# Patient Record
Sex: Female | Born: 1995 | Race: Black or African American | Hispanic: No | Marital: Single | Smoking: Never smoker
Health system: Southern US, Community
[De-identification: ages and names within clinical notes are randomized; demographics above are authoritative.]

## PROBLEM LIST (undated history)

## (undated) DIAGNOSIS — N946 Dysmenorrhea, unspecified: Secondary | ICD-10-CM

## (undated) HISTORY — DX: Dysmenorrhea, unspecified: N94.6

---

## 2014-11-15 ENCOUNTER — Encounter: Payer: Self-pay | Admitting: Certified Nurse Midwife

## 2014-11-15 ENCOUNTER — Ambulatory Visit (INDEPENDENT_AMBULATORY_CARE_PROVIDER_SITE_OTHER): Payer: BLUE CROSS/BLUE SHIELD | Admitting: Certified Nurse Midwife

## 2014-11-15 ENCOUNTER — Encounter: Payer: Self-pay | Admitting: Emergency Medicine

## 2014-11-15 VITALS — BP 110/62 | HR 68 | Resp 16 | Ht 64.25 in | Wt 226.0 lb

## 2014-11-15 DIAGNOSIS — N6320 Unspecified lump in the left breast, unspecified quadrant: Secondary | ICD-10-CM

## 2014-11-15 DIAGNOSIS — N63 Unspecified lump in breast: Secondary | ICD-10-CM

## 2014-11-15 NOTE — Progress Notes (Signed)
Patient scheduled for ultrasound of L Breast at [facility] imaging for 11/16/14 at 0800. Patient agreeable to time/date/location.

## 2014-11-15 NOTE — Progress Notes (Signed)
Reviewed personally.  M. Suzanne Skyra Crichlow, MD.  

## 2014-11-15 NOTE — Progress Notes (Signed)
18 y.o. G0P0000 Single  African American Fe here to establish gyn care and for problem with left breast only. Patient noted lump in breast on SBE on 11/10/14, [place] and was told to see gyn for evaluation and needs ? Mammogram and US. Denies skin change, nipple discharge or pain.Marland Kitchen.No family history of breast cancer. 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. Previous use of OCP for contraception until 9/15. Currently uses condoms. [place]  [place] which was normal per patient. No other health issues today.  Patient's last menstrual period was 11/12/2014.          Sexually active: Yes.    The current method of family planning is none.    Exercising: No.  exercise Smoker:  no  Health Maintenance: Pap:  none MMG:  none Colonoscopy:  none BMD:   none TDaP:  2014 Labs: none Self breast exam: done occ   reports that she has never smoked. She does not have any smokeless tobacco history on file. She reports that she does not drink alcohol or use illicit drugs.  Past Medical History  Diagnosis Date  . Dysmenorrhea     History reviewed. No pertinent past surgical history.  No current outpatient prescriptions on file.   No current facility-administered medications for this visit.    History reviewed. No pertinent family history.  ROS:  Pertinent items are noted in HPI.  Otherwise, a comprehensive ROS was negative.  Exam:   BP 110/62 mmHg  Pulse 68  Resp 16  Ht 5' 4.25" (1.632 m)  Wt 226 lb (102.513 kg)  BMI 38.49 kg/m2  LMP 11/12/2014 Height: 5' 4.25" (163.2 cm) Ht Readings from Last 3 Encounters:  11/15/14 5' 4.25" (1.632 m)    General appearance: alert, cooperative and appears stated age Head: Normocephalic, without obvious abnormality, atraumatic Breasts: normal appearance, no masses or tenderness, No nipple retraction or dimpling, No nipple discharge or bleeding, No axillary or supraclavicular adenopathy, on right. Left breast normal appearance, no nipple  retraction or dimpling. No mipple discharge or bleeding, No axillary or supraclavicular adenopathy. 3 cm moveable mass noted at 10 o'clock  5 fb from areola, non tender to touch.  Neurologic: Grossly normal   Chaperone present: NO  A:  Left breast mass  Normal breast exam on right, large pendulous breasts  P:   Reviewed findings with patient and possible etiology of cyst or fibroadenoma. Discussed needs evaluation with US and possible diagnostic mammogram if needed. Patient agreeable to above. Will schedule.

## 2014-11-16 ENCOUNTER — Ambulatory Visit
Admission: RE | Admit: 2014-11-16 | Discharge: 2014-11-16 | Disposition: A | Discharge status: Home / Self Care | Payer: BC Managed Care – PPO | Source: Ambulatory Visit | Attending: Certified Nurse Midwife | Admitting: Certified Nurse Midwife

## 2014-11-16 ENCOUNTER — Other Ambulatory Visit: Payer: Self-pay | Admitting: Certified Nurse Midwife

## 2014-11-16 DIAGNOSIS — N6320 Unspecified lump in the left breast, unspecified quadrant: Secondary | ICD-10-CM

## 2014-11-22 ENCOUNTER — Telehealth: Payer: Self-pay | Admitting: Emergency Medicine

## 2014-11-22 NOTE — Telephone Encounter (Signed)
Agree with recall.  Encounter closed.

## 2014-11-22 NOTE — Telephone Encounter (Signed)
-----   Message from Verner Choleborah S Leonard, CNM sent at 11/17/2014  4:51 PM EST ----- Reviewed left breast us, palpable mass felt to be fat necrosis and needs follow up in 6 weeks Mm hold and recall?

## 2014-11-22 NOTE — Telephone Encounter (Signed)
Spoke with patient.  She has follow up ultrasound scheduled at the breast center for 12/28/14.  Scheduled breast recheck with Verner Choleborah S. Leonard CNM. For 01/04/15.   6 week recall placed.  Will continue to hold.   Routing to Verner Choleborah S. Leonard CNM  And Dr. Hyacinth MeekerMiller.   Okay to close?

## 2014-11-22 NOTE — Telephone Encounter (Signed)
yes

## 2014-12-28 ENCOUNTER — Telehealth: Payer: Self-pay | Admitting: Emergency Medicine

## 2014-12-28 ENCOUNTER — Other Ambulatory Visit: Payer: BC Managed Care – PPO

## 2014-12-28 NOTE — Telephone Encounter (Signed)
Call to patient. She did not keep appointment today for follow up ultrasound and has breast check scheduled. Patient states she has appointment at breast center on 3/6, called and spoke with cherish at breast center and she confirms patient does not have appointment.   Will call patient and discuss, reschedule breast check for after follow up ultrasound.

## 2014-12-29 ENCOUNTER — Encounter (HOSPITAL_COMMUNITY): Payer: Self-pay | Admitting: Emergency Medicine

## 2014-12-29 ENCOUNTER — Other Ambulatory Visit: Payer: Self-pay

## 2014-12-29 ENCOUNTER — Emergency Department (HOSPITAL_COMMUNITY)
Admission: EM | Admit: 2014-12-29 | Discharge: 2014-12-29 | Disposition: A | Discharge status: Home / Self Care | Payer: BC Managed Care – PPO | Attending: Emergency Medicine | Admitting: Emergency Medicine

## 2014-12-29 DIAGNOSIS — Z8742 Personal history of other diseases of the female genital tract: Secondary | ICD-10-CM | POA: Insufficient documentation

## 2014-12-29 DIAGNOSIS — J039 Acute tonsillitis, unspecified: Secondary | ICD-10-CM

## 2014-12-29 DIAGNOSIS — J029 Acute pharyngitis, unspecified: Secondary | ICD-10-CM | POA: Diagnosis present

## 2014-12-29 DIAGNOSIS — K088 Other specified disorders of teeth and supporting structures: Secondary | ICD-10-CM | POA: Insufficient documentation

## 2014-12-29 LAB — RAPID STREP SCREEN (MED CTR MEBANE ONLY): Streptococcus, Group A Screen (Direct): NEGATIVE

## 2014-12-29 MED ORDER — IBUPROFEN 800 MG PO TABS: 800.0000 mg | ORAL_TABLET | Freq: Three times a day (TID) | ORAL | Status: DC

## 2014-12-29 NOTE — Discharge Instructions (Signed)
Take ibuprofen every 8 hours as needed for pain. Use salt water gargles. Rest and stay well hydrated.  Tonsillitis Tonsillitis is an infection of the throat that causes the tonsils to become red, tender, and swollen. Tonsils are collections of lymphoid tissue at the back of the throat. Each tonsil has crevices (crypts). Tonsils help fight nose and throat infections and keep infection from spreading to other parts of the body for the first 18 months of life.  CAUSES Sudden (acute) tonsillitis is usually caused by infection with streptococcal bacteria. Long-lasting (chronic) tonsillitis occurs when the crypts of the tonsils become filled with pieces of food and bacteria, which makes it easy for the tonsils to become repeatedly infected. SYMPTOMS  Symptoms of tonsillitis include:  A sore throat, with possible difficulty swallowing.  White patches on the tonsils.  Fever.  Tiredness.  New episodes of snoring during sleep, when you did not snore before.  Small, foul-smelling, yellowish-white pieces of material (tonsilloliths) that you occasionally cough up or spit out. The tonsilloliths can also cause you to have bad breath. DIAGNOSIS Tonsillitis can be diagnosed through a physical exam. Diagnosis can be confirmed with the results of lab tests, including a throat culture. TREATMENT  The goals of tonsillitis treatment include the reduction of the severity and duration of symptoms and prevention of associated conditions. Symptoms of tonsillitis can be improved with the use of steroids to reduce the swelling. Tonsillitis caused by bacteria can be treated with antibiotic medicines. Usually, treatment with antibiotic medicines is started before the cause of the tonsillitis is known. However, if it is determined that the cause is not bacterial, antibiotic medicines will not treat the tonsillitis. If attacks of tonsillitis are severe and frequent, your health care provider may recommend surgery to remove  the tonsils (tonsillectomy). HOME CARE INSTRUCTIONS   Rest as much as possible and get plenty of sleep.  Drink plenty of fluids. While the throat is very sore, eat soft foods or liquids, such as sherbet, soups, or instant breakfast drinks.  Eat frozen ice pops.  Gargle with a warm or cold liquid to help soothe the throat. Mix 1/4 teaspoon of salt and 1/4 teaspoon of baking soda in 8 oz of water. SEEK MEDICAL CARE IF:   Large, tender lumps develop in your neck.  A rash develops.  A green, yellow-brown, or bloody substance is coughed up.  You are unable to swallow liquids or food for 24 hours.  You notice that only one of the tonsils is swollen. SEEK IMMEDIATE MEDICAL CARE IF:   You develop any new symptoms such as vomiting, severe headache, stiff neck, chest pain, or trouble breathing or swallowing.  You have severe throat pain along with drooling or voice changes.  You have severe pain, unrelieved with recommended medications.  You are unable to fully open the mouth.  You develop redness, swelling, or severe pain anywhere in the neck.  You have a fever. MAKE SURE YOU:   Understand these instructions.  Will watch your condition.  Will get help right away if you are not doing well or get worse. Document Released: 07/29/2005 Document Revised: 03/05/2014 Document Reviewed: 04/07/2013 [facility] Patient Information 2015 [place]. This information is not intended to replace advice given to you by your health care provider. Make sure you discuss any questions you have with your health care provider.  Upper Respiratory Infection, Adult An upper respiratory infection (URI) is also sometimes known as the common cold. The upper respiratory tract includes the  nose, sinuses, throat, trachea, and bronchi. Bronchi are the airways leading to the lungs. Most people improve within 1 week, but symptoms can last up to 2 weeks. A residual cough may last even longer.  CAUSES Many  different viruses can infect the tissues lining the upper respiratory tract. The tissues become irritated and inflamed and often become very moist. Mucus production is also common. A cold is contagious. You can easily spread the virus to others by oral contact. This includes kissing, sharing a glass, coughing, or sneezing. Touching your mouth or nose and then touching a surface, which is then touched by another person, can also spread the virus. SYMPTOMS  Symptoms typically develop 1 to 3 days after you come in contact with a cold virus. Symptoms vary from person to person. They may include:  Runny nose.  Sneezing.  Nasal congestion.  Sinus irritation.  Sore throat.  Loss of voice (laryngitis).  Cough.  Fatigue.  Muscle aches.  Loss of appetite.  Headache.  Low-grade fever. DIAGNOSIS  You might diagnose your own cold based on familiar symptoms, since most people get a cold 2 to 3 times a year. Your caregiver can confirm this based on your exam. Most importantly, your caregiver can check that your symptoms are not due to another disease such as strep throat, sinusitis, pneumonia, asthma, or epiglottitis. Blood tests, throat tests, and X-rays are not necessary to diagnose a common cold, but they may sometimes be helpful in excluding other more serious diseases. Your caregiver will decide if any further tests are required. RISKS AND COMPLICATIONS  You may be at risk for a more severe case of the common cold if you smoke cigarettes, have chronic heart disease (such as heart failure) or lung disease (such as asthma), or if you have a weakened immune system. The very young and very old are also at risk for more serious infections. Bacterial sinusitis, middle ear infections, and bacterial pneumonia can complicate the common cold. The common cold can worsen asthma and chronic obstructive pulmonary disease (COPD). Sometimes, these complications can require emergency medical care and may be  life-threatening. PREVENTION  The best way to protect against getting a cold is to practice good hygiene. Avoid oral or hand contact with people with cold symptoms. Wash your hands often if contact occurs. There is no clear evidence that vitamin C, vitamin E, echinacea, or exercise reduces the chance of developing a cold. However, it is always recommended to get plenty of rest and practice good nutrition. TREATMENT  Treatment is directed at relieving symptoms. There is no cure. Antibiotics are not effective, because the infection is caused by a virus, not by bacteria. Treatment may include:  Increased fluid intake. Sports drinks offer valuable electrolytes, sugars, and fluids.  Breathing heated mist or steam (vaporizer or shower).  Eating chicken soup or other clear broths, and maintaining good nutrition.  Getting plenty of rest.  Using gargles or lozenges for comfort.  Controlling fevers with ibuprofen or acetaminophen as directed by your caregiver.  Increasing usage of your inhaler if you have asthma. Zinc gel and zinc lozenges, taken in the first 24 hours of the common cold, can shorten the duration and lessen the severity of symptoms. Pain medicines may help with fever, muscle aches, and throat pain. A variety of non-prescription medicines are available to treat congestion and runny nose. Your caregiver can make recommendations and may suggest nasal or lung inhalers for other symptoms.  HOME CARE INSTRUCTIONS   Only take over-the-counter  or prescription medicines for pain, discomfort, or fever as directed by your caregiver.  Use a warm mist humidifier or inhale steam from a shower to increase air moisture. This may keep secretions moist and make it easier to breathe.  Drink enough water and fluids to keep your urine clear or pale yellow.  Rest as needed.  Return to work when your temperature has returned to normal or as your caregiver advises. You may need to stay home longer to  avoid infecting others. You can also use a face mask and careful hand washing to prevent spread of the virus. SEEK MEDICAL CARE IF:   After the first few days, you feel you are getting worse rather than better.  You need your caregiver's advice about medicines to control symptoms.  You develop chills, worsening shortness of breath, or brown or red sputum. These may be signs of pneumonia.  You develop yellow or brown nasal discharge or pain in the face, especially when you bend forward. These may be signs of sinusitis.  You develop a fever, swollen neck glands, pain with swallowing, or white areas in the back of your throat. These may be signs of strep throat. SEEK IMMEDIATE MEDICAL CARE IF:   You have a fever.  You develop severe or persistent headache, ear pain, sinus pain, or chest pain.  You develop wheezing, a prolonged cough, cough up blood, or have a change in your usual mucus (if you have chronic lung disease).  You develop sore muscles or a stiff neck. Document Released: 04/14/2001 Document Revised: 01/11/2012 Document Reviewed: 01/24/2014 [facility] Patient Information 2015 [place], [place]. This information is not intended to replace advice given to you by your health care provider. Make sure you discuss any questions you have with your health care provider. Salt Water Gargle This solution will help make your mouth and throat feel better. HOME CARE INSTRUCTIONS   Mix 1 teaspoon of salt in 8 ounces of warm water.  Gargle with this solution as much or often as you need or as directed. Swish and gargle gently if you have any sores or wounds in your mouth.  Do not swallow this mixture. Document Released: 07/23/2004 Document Revised: 01/11/2012 Document Reviewed: 12/14/2008 [facility] Patient Information 2015 [place], [place]. This information is not intended to replace advice given to you by your health care provider. Make sure you discuss any questions you have with your health care  provider.

## 2014-12-29 NOTE — ED Notes (Signed)
Pt. Stated, My tonsils are swollen and I hurt on the rt. Side of my mouth

## 2014-12-29 NOTE — ED Notes (Signed)
Declined W/C at D/C and was escorted to lobby by RN. 

## 2014-12-29 NOTE — ED Provider Notes (Signed)
CSN: [id number]     Arrival date & time 12/29/14  1043 History  This chart was scribed for Celene Skeenobyn Edrian Melucci, PA-C working with Arby BarretteMarcy Pfeiffer, MD by Elveria Risingimelie Horne, ED Scribe. This patient was seen in room TR05C/TR05C and the patient's care was started at 11:07 AM.   Chief Complaint  Patient presents with  . Sore Throat  . Dental Pain   The history is provided by the patient. No language interpreter was used.   HPI Comments: Karina Meadows is a 19 y.o. female who presents to the Emergency Department complaining of sore throat for two days. In additional to her tonsillar pain and swelling, patient reports pain and swelling in her right cheek extending to her gum; patient denies true dental pain. Patient reports treatment with Benadryl, without relief. Patient reports associated cough, subjective fever, and hoarse voice. Patient denies vomiting. Patient denies recent sick contacts nor does she work with children.   Past Medical History  Diagnosis Date  . Dysmenorrhea    History reviewed. No pertinent past surgical history. Family History  Problem Relation Age of Onset  . Cancer Maternal Grandmother   . Diabetes Maternal Grandmother   . Hypertension Maternal Grandmother    History  Substance Use Topics  . Smoking status: Never Smoker   . Smokeless tobacco: Not on file  . Alcohol Use: No   OB History    Gravida Para Term Preterm AB TAB SAB Ectopic Multiple Living   0 0 0 0 0 0 0 0 0 0      Review of Systems  Constitutional: Negative for fever and chills.  HENT: Positive for sore throat and voice change. Negative for dental problem and trouble swallowing.   Respiratory: Positive for cough.   Gastrointestinal: Negative for vomiting.   Allergies  Review of patient's allergies indicates no known allergies.  Home Medications   Prior to Admission medications   Medication Sig Start Date End Date Taking? Authorizing Provider  ibuprofen (ADVIL,MOTRIN) 800 MG tablet Take 1 tablet (800 mg  total) by mouth 3 (three) times daily. 12/29/14   Kathrynn Speedobyn M Lurie Mullane, PA-C   Triage Vitals: BP 113/80 mmHg  Pulse 69  Temp(Src) 98.3 F (36.8 C) (Oral)  Resp 18  SpO2 100%  LMP 12/07/2014 Physical Exam  Constitutional: She is oriented to person, place, and time. She appears well-developed and well-nourished. No distress.  HENT:  Head: Normocephalic and atraumatic.  Tonsils enlarged and inflamed bilateral +2 on R +3 on L without exudate. No tonsillar abscess.  Eyes: Conjunctivae and EOM are normal.  Neck: Normal range of motion. Neck supple.  Cardiovascular: Normal rate, regular rhythm and normal heart sounds.   Pulmonary/Chest: Effort normal and breath sounds normal. No respiratory distress.  Musculoskeletal: Normal range of motion. She exhibits no edema.  Lymphadenopathy:    She has no cervical adenopathy.  Neurological: She is alert and oriented to person, place, and time. No sensory deficit.  Skin: Skin is warm and dry.  Psychiatric: She has a normal mood and affect. Her behavior is normal.  Nursing note and vitals reviewed.   ED Course  Procedures (including critical care time)  COORDINATION OF CARE: 11:10 AM- Discussed treatment plan with patient at bedside and patient agreed to plan.   Labs Review Labs Reviewed  RAPID STREP SCREEN    Imaging Review No results found.   EKG Interpretation None      MDM   Final diagnoses:  Tonsillitis   NAD. AFVSS. Does not meet centor criteria  for strep tx. Rapid strep negative. Discussed symptomatic treatment. Swallows secretions well. Dentition normal. Stable for d/c. Return precautions given. Patient states understanding of treatment care plan and is agreeable.  I personally performed the services described in this documentation, which was scribed in my presence. The recorded information has been reviewed and is accurate.  Kathrynn Speed, PA-C 12/29/14 1143  Arby Barrette, MD 01/02/15 [phone]

## 2014-12-31 LAB — CULTURE, GROUP A STREP

## 2014-12-31 NOTE — Telephone Encounter (Signed)
Call to patient. She cannot keep appointment for 01/07/15 as scheduled. Requested patient call [facility] directly to schedule appointment that will work with her schedule. Advised that appointment on 01/06/15 did not exist because was Saturday date.   Cancelled appointment for tomorrow with Verner Choleborah S. Leonard CNM for Breast Check per patient request as patient will have ultrasound first. Advised patient to call back with ultrasound schedule and will schedule follow up breast check at this time. Patient remains in Mammogram hold.

## 2014-12-31 NOTE — Telephone Encounter (Signed)
Called patient. Scheduled ultrasound for 01/08/15 at 1300 per her request.  Then, office visit for breast check with Verner Choleborah S. Leonard CNM scheduled for same date at 1430.   Patient is agreeable with this plan. Routing to provider for final review. Patient agreeable to disposition. Will close encounter

## 2014-12-31 NOTE — Telephone Encounter (Signed)
Call to patient. Voicemail box not set up yet.   Patient does not have an ultrasound appointment scheduled for 3/6 (saturday) patient was scheduled for 12/28/14 but missed appointment for follow up breast ultrasound. Scheduled ultrasound appointment and will need to notify patient of new appointment and reschedule follow up breast check for after ultrasound.

## 2015-01-01 ENCOUNTER — Ambulatory Visit: Payer: BLUE CROSS/BLUE SHIELD | Admitting: Certified Nurse Midwife

## 2015-01-04 ENCOUNTER — Ambulatory Visit: Payer: BLUE CROSS/BLUE SHIELD | Admitting: Certified Nurse Midwife

## 2015-01-07 ENCOUNTER — Other Ambulatory Visit: Payer: BC Managed Care – PPO

## 2015-01-08 ENCOUNTER — Ambulatory Visit (INDEPENDENT_AMBULATORY_CARE_PROVIDER_SITE_OTHER): Payer: BLUE CROSS/BLUE SHIELD | Admitting: Certified Nurse Midwife

## 2015-01-08 ENCOUNTER — Ambulatory Visit
Admission: RE | Admit: 2015-01-08 | Discharge: 2015-01-08 | Disposition: A | Discharge status: Home / Self Care | Payer: BC Managed Care – PPO | Source: Ambulatory Visit | Attending: Certified Nurse Midwife | Admitting: Certified Nurse Midwife

## 2015-01-08 ENCOUNTER — Encounter: Payer: Self-pay | Admitting: Certified Nurse Midwife

## 2015-01-08 VITALS — BP 104/78 | HR 80 | Ht 64.25 in | Wt 225.8 lb

## 2015-01-08 DIAGNOSIS — Z30011 Encounter for initial prescription of contraceptive pills: Secondary | ICD-10-CM

## 2015-01-08 DIAGNOSIS — N6320 Unspecified lump in the left breast, unspecified quadrant: Secondary | ICD-10-CM

## 2015-01-08 DIAGNOSIS — N63 Unspecified lump in breast: Secondary | ICD-10-CM

## 2015-01-08 MED ORDER — NORETHINDRONE-ETH ESTRADIOL 1-35 MG-MCG PO TABS: 1.0000 | ORAL_TABLET | Freq: Every day | ORAL | Status: DC

## 2015-01-08 NOTE — Patient Instructions (Signed)
Oral Contraception Use Oral contraceptive pills (OCPs) are medicines taken to prevent pregnancy. OCPs work by preventing the ovaries from releasing eggs. The hormones in OCPs also cause the cervical mucus to thicken, preventing the sperm from entering the uterus. The hormones also cause the uterine lining to become thin, not allowing a fertilized egg to attach to the inside of the uterus. OCPs are highly effective when taken exactly as prescribed. However, OCPs do not prevent sexually transmitted diseases (STDs). Safe sex practices, such as using condoms along with an OCP, can help prevent STDs. Before taking OCPs, you may have a physical exam and Pap test. Your health care provider may also order blood tests if necessary. Your health care provider will make sure you are a good candidate for oral contraception. Discuss with your health care provider the possible side effects of the OCP you may be prescribed. When starting an OCP, it can take 2 to 3 months for the body to adjust to the changes in hormone levels in your body.  HOW TO TAKE ORAL CONTRACEPTIVE PILLS Your health care provider may advise you on how to start taking the first cycle of OCPs. Otherwise, you can:   Start on day 1 of your menstrual period. You will not need any backup contraceptive protection with this start time.   Start on the first Sunday after your menstrual period or the day you get your prescription. In these cases, you will need to use backup contraceptive protection for the first week.   Start the pill at any time of your cycle. If you take the pill within 5 days of the start of your period, you are protected against pregnancy right away. In this case, you will not need a backup form of birth control. If you start at any other time of your menstrual cycle, you will need to use another form of birth control for 7 days. If your OCP is the type called a minipill, it will protect you from pregnancy after taking it for 2 days (48  hours). After you have started taking OCPs:   If you forget to take 1 pill, take it as soon as you remember. Take the next pill at the regular time.   If you miss 2 or more pills, call your health care provider because different pills have different instructions for missed doses. Use backup birth control until your next menstrual period starts.   If you use a 28-day pack that contains inactive pills and you miss 1 of the last 7 pills (pills with no hormones), it will not matter. Throw away the rest of the non-hormone pills and start a new pill pack.  No matter which day you start the OCP, you will always start a new pack on that same day of the week. Have an extra pack of OCPs and a backup contraceptive method available in case you miss some pills or lose your OCP pack.  HOME CARE INSTRUCTIONS   Do not smoke.   Always use a condom to protect against STDs. OCPs do not protect against STDs.   Use a calendar to mark your menstrual period days.   Read the information and directions that came with your OCP. Talk to your health care provider if you have questions.  SEEK MEDICAL CARE IF:   You develop nausea and vomiting.   You have abnormal vaginal discharge or bleeding.   You develop a rash.   You miss your menstrual period.   You are losing   your hair.   You need treatment for mood swings or depression.   You get dizzy when taking the OCP.   You develop acne from taking the OCP.   You become pregnant.  SEEK IMMEDIATE MEDICAL CARE IF:   You develop chest pain.   You develop shortness of breath.   You have an uncontrolled or severe headache.   You develop numbness or slurred speech.   You develop visual problems.   You develop pain, redness, and swelling in the legs.  Document Released: 10/08/2011 Document Revised: 03/05/2014 Document Reviewed: 04/09/2013 [employer] Patient Information 2015 [employer]. This information is not intended to replace  advice given to you by your health care provider. Make sure you discuss any questions you have with your health care provider.  

## 2015-01-08 NOTE — Progress Notes (Signed)
19 y.o. Single African American female G0P0000 here for follow up of left breast mass which was found to be a fat necrosis mass. Repeat US at imaging shows area has decreased in size. Follow recommended in 3 months. Patient has not noted any tenderness and feels area is smaller. Continues SBE monthly. She currently needs refill on OCP and aex not due until 7/16 which she plans here. Living in [place] and commuting for college here. Agreeable to exam to refill OCP. Periods normal, no issues. No missed OCP. No STD concerns. No other health concerns today.  O: Healthy WD,WN female Affect: normal Skin:warm and dry Heart: NSRR Lung: Clear Abdomen:soft, nontender, normal Pelvic exam:EXTERNAL GENITALIA: normal appearing vulva with no masses, tenderness or lesions VAGINA: no abnormal discharge or lesions CERVIX: no lesions or cervical motion tenderness and normal UTERUS: normal,non tender ADNEXA: no masses palpable and nontender  A:Normal female exam Contraception OCP desired Left breast mass under follow up, noted as benign with fat necrosis, with decrease in size at US today, with 3 month follow up recommended. MM recall placed.    P: Discussed findings of normal exam and no contraindication for OCP continuance. Rx Ortho Novum 1/35 see order Keep follow up breast US and continue SBE and advise if change. Patient agreeable.  Rv aex 7/16

## 2015-01-09 NOTE — Progress Notes (Signed)
Reviewed personally.  M. Suzanne Myleah Cavendish, MD.  

## 2015-05-30 ENCOUNTER — Ambulatory Visit: Payer: BLUE CROSS/BLUE SHIELD | Admitting: Certified Nurse Midwife

## 2015-07-04 ENCOUNTER — Telehealth: Payer: Self-pay | Admitting: *Deleted

## 2015-07-04 ENCOUNTER — Emergency Department (HOSPITAL_BASED_OUTPATIENT_CLINIC_OR_DEPARTMENT_OTHER): Payer: No Typology Code available for payment source

## 2015-07-04 ENCOUNTER — Encounter (HOSPITAL_BASED_OUTPATIENT_CLINIC_OR_DEPARTMENT_OTHER): Payer: Self-pay | Admitting: *Deleted

## 2015-07-04 ENCOUNTER — Emergency Department (HOSPITAL_BASED_OUTPATIENT_CLINIC_OR_DEPARTMENT_OTHER)
Admission: EM | Admit: 2015-07-04 | Discharge: 2015-07-04 | Disposition: A | Discharge status: Home / Self Care | Payer: No Typology Code available for payment source | Attending: Emergency Medicine | Admitting: Emergency Medicine

## 2015-07-04 DIAGNOSIS — Z3202 Encounter for pregnancy test, result negative: Secondary | ICD-10-CM | POA: Insufficient documentation

## 2015-07-04 DIAGNOSIS — S199XXA Unspecified injury of neck, initial encounter: Secondary | ICD-10-CM | POA: Diagnosis present

## 2015-07-04 DIAGNOSIS — Z8742 Personal history of other diseases of the female genital tract: Secondary | ICD-10-CM | POA: Insufficient documentation

## 2015-07-04 DIAGNOSIS — Z793 Long term (current) use of hormonal contraceptives: Secondary | ICD-10-CM | POA: Diagnosis not present

## 2015-07-04 DIAGNOSIS — S161XXA Strain of muscle, fascia and tendon at neck level, initial encounter: Secondary | ICD-10-CM | POA: Insufficient documentation

## 2015-07-04 DIAGNOSIS — Y92410 Unspecified street and highway as the place of occurrence of the external cause: Secondary | ICD-10-CM | POA: Insufficient documentation

## 2015-07-04 DIAGNOSIS — Y9389 Activity, other specified: Secondary | ICD-10-CM | POA: Diagnosis not present

## 2015-07-04 DIAGNOSIS — S3992XA Unspecified injury of lower back, initial encounter: Secondary | ICD-10-CM | POA: Diagnosis not present

## 2015-07-04 DIAGNOSIS — S299XXA Unspecified injury of thorax, initial encounter: Secondary | ICD-10-CM | POA: Insufficient documentation

## 2015-07-04 DIAGNOSIS — Y998 Other external cause status: Secondary | ICD-10-CM | POA: Diagnosis not present

## 2015-07-04 LAB — PREGNANCY, URINE: Preg Test, Ur: NEGATIVE

## 2015-07-04 MED ORDER — IBUPROFEN 800 MG PO TABS: 800.0000 mg | ORAL_TABLET | Freq: Three times a day (TID) | ORAL | Status: AC

## 2015-07-04 MED ORDER — CYCLOBENZAPRINE HCL 10 MG PO TABS
10.0000 mg | ORAL_TABLET | Freq: Two times a day (BID) | ORAL | Status: AC | PRN
Start: 2015-07-04 — End: ?
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
## 2015-07-04 NOTE — Discharge Instructions (Signed)

## 2015-07-04 NOTE — Telephone Encounter (Signed)
Tried to call patient, got a message that says "We're sorry your call cannot be completed as dialed, please check the number and try again".
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
## 2015-07-04 NOTE — Telephone Encounter (Signed)
Patient due for 3 month left breast ultrasound.  Last MMG:  01/08/15  IMPRESSION: Probable fat necrosis in the left breast.  RECOMMENDATION: Short-term interval followup clinical exam and ultrasound in 3 months is recommended.   Pt overdue for left breast ultrasound recall.  Due June 2016.  Follow up appointment has not been made.  Please call patient to schedule.  [facility].
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
## 2015-07-05 ENCOUNTER — Other Ambulatory Visit: Payer: Self-pay | Admitting: Certified Nurse Midwife

## 2015-07-05 DIAGNOSIS — N63 Unspecified lump in unspecified breast: Secondary | ICD-10-CM

## 2015-07-05 NOTE — Telephone Encounter (Signed)
Called and s/w patient she asked if we could schedule her for her follow up Ultrasound. Called the patient to notify her of her appointment for September 13th at 1:00, got her vm which she doesn't have set up. Will keep trying to call patient to see if this will work for her.

## 2015-07-05 NOTE — Telephone Encounter (Signed)
Tried to call patient got a message that said the prescriber is not accepting calls

## 2015-07-11 NOTE — Telephone Encounter (Signed)
Attempted to call patient with appt information.  Pt voicemail is not set up.  I will continue to try to call patient.

## 2015-07-15 ENCOUNTER — Other Ambulatory Visit: Payer: Self-pay

## 2015-07-15 ENCOUNTER — Other Ambulatory Visit: Payer: Self-pay | Admitting: Certified Nurse Midwife

## 2015-07-15 DIAGNOSIS — N63 Unspecified lump in unspecified breast: Secondary | ICD-10-CM

## 2015-07-16 ENCOUNTER — Other Ambulatory Visit: Payer: BC Managed Care – PPO

## 2015-08-08 ENCOUNTER — Encounter: Payer: Self-pay | Admitting: Certified Nurse Midwife

## 2015-08-08 ENCOUNTER — Ambulatory Visit (INDEPENDENT_AMBULATORY_CARE_PROVIDER_SITE_OTHER): Payer: BC Managed Care – PPO | Admitting: Certified Nurse Midwife

## 2015-08-08 VITALS — BP 102/64 | HR 70 | Resp 16 | Ht 64.25 in | Wt 225.0 lb

## 2015-08-08 DIAGNOSIS — Z0000 Encounter for general adult medical examination without abnormal findings: Secondary | ICD-10-CM | POA: Diagnosis not present

## 2015-08-08 DIAGNOSIS — Z3041 Encounter for surveillance of contraceptive pills: Secondary | ICD-10-CM | POA: Diagnosis not present

## 2015-08-08 DIAGNOSIS — Z124 Encounter for screening for malignant neoplasm of cervix: Secondary | ICD-10-CM | POA: Diagnosis not present

## 2015-08-08 DIAGNOSIS — Z30011 Encounter for initial prescription of contraceptive pills: Secondary | ICD-10-CM | POA: Diagnosis not present

## 2015-08-08 DIAGNOSIS — N926 Irregular menstruation, unspecified: Secondary | ICD-10-CM | POA: Diagnosis not present

## 2015-08-08 DIAGNOSIS — Z01419 Encounter for gynecological examination (general) (routine) without abnormal findings: Secondary | ICD-10-CM

## 2015-08-08 LAB — HIV ANTIBODY (ROUTINE TESTING W REFLEX): HIV: NONREACTIVE

## 2015-08-08 LAB — POCT URINE PREGNANCY: PREG TEST UR: NEGATIVE

## 2015-08-08 LAB — POCT URINALYSIS DIPSTICK
Bilirubin, UA: NEGATIVE
Blood, UA: NEGATIVE
GLUCOSE UA: NEGATIVE
Ketones, UA: NEGATIVE
LEUKOCYTES UA: NEGATIVE
NITRITE UA: NEGATIVE
PROTEIN UA: NEGATIVE
UROBILINOGEN UA: NEGATIVE
pH, UA: 5

## 2015-08-08 LAB — HEMOGLOBIN, FINGERSTICK: HEMOGLOBIN, FINGERSTICK: 11.6 g/dL — AB (ref 12.0–16.0)

## 2015-08-08 LAB — RPR

## 2015-08-08 MED ORDER — NORETHINDRONE-ETH ESTRADIOL 1-35 MG-MCG PO TABS: 1.0000 | ORAL_TABLET | Freq: Every day | ORAL | Status: AC

## 2015-08-08 NOTE — Progress Notes (Signed)
19 y.o. G0P0000 Single  African American Fe here for annual exam. Periods normal, no issues. OCP working well for contraception. Had missed one pill only since using them Did not use condom on one occurrence and used Plan B which gave her spotting, nausea with use. This resolved shortly after use. On regular menses today. Partner change desires STD screening. Patient due breast follow up for cyst with [place]. She can not feel area in left breast now. Sees Urgent care if needed. No other health issues today. Finishing college in the next year!  LMP: 08-04-15         Sexually active: Yes.    The current method of family planning is OCP (estrogen/progesterone).    Exercising: No.  exercise Smoker:  no  Health Maintenance: Pap:  none MMG:  Left breast u/s 01-08-15 category 3:prob benign, short interval f/u suggested Colonoscopy:  none BMD:   none TDaP:  2014 Labs: poct urine-neg, hgb-11.6,upt-neg Self breast exam: done monthly   reports that she has never smoked. She has never used smokeless tobacco. She reports that she does not drink alcohol or use illicit drugs.  Past Medical History  Diagnosis Date  . Dysmenorrhea     History reviewed. No pertinent past surgical history.  Current Outpatient Prescriptions  Medication Sig Dispense Refill  . cyclobenzaprine (FLEXERIL) 10 MG tablet Take 1 tablet (10 mg total) by mouth 2 (two) times daily as needed for muscle spasms. 20 tablet 0  . ibuprofen (ADVIL,MOTRIN) 800 MG tablet Take 1 tablet (800 mg total) by mouth 3 (three) times daily. 21 tablet 0  . norethindrone-ethinyl estradiol 1/35 (ORTHO-NOVUM, NORTREL,CYCLAFEM) tablet Take 1 tablet by mouth daily. 1 Package 4   No current facility-administered medications for this visit.    Family History  Problem Relation Age of Onset  . Cancer Maternal Grandmother   . Diabetes Maternal Grandmother   . Hypertension Maternal Grandmother     ROS:  Pertinent items are noted in HPI.  Otherwise, a  comprehensive ROS was negative.  Exam:   BP 102/64 mmHg  Pulse 70  Resp 16  Ht 5' 4.25" (1.632 m)  Wt 225 lb (102.059 kg)  BMI 38.32 kg/m2  LMP 08/04/2015 Height: 5' 4.25" (163.2 cm) Ht Readings from Last 3 Encounters:  08/08/15 5' 4.25" (1.632 m)  07/04/15  (1.626 m)  01/08/15 5' 4.25" (1.632 m)    General appearance: alert, cooperative and appears stated age Head: Normocephalic, without obvious abnormality, atraumatic Neck: no adenopathy, supple, symmetrical, trachea midline and thyroid normal to inspection and palpation Lungs: clear to auscultation bilaterally Breasts: normal appearance, no masses or tenderness, No nipple retraction or dimpling, No nipple discharge or bleeding, No axillary or supraclavicular adenopathy, ? very tiny palpable mass at 10 o'clock in left breast corresponding previous mass due for follow up US. Heart: regular rate and rhythm Abdomen: soft, non-tender; no masses,  no organomegaly Extremities: extremities normal, atraumatic, no cyanosis or edema Skin: Skin color, texture, turgor normal. No rashes or lesions Lymph nodes: Cervical, supraclavicular, and axillary nodes normal. No abnormal inguinal nodes palpated Neurologic: Grossly normal   Pelvic: External genitalia:  no lesions              Urethra:  normal appearing urethra with no masses, tenderness or lesions              Bartholin's and Skene's: normal                 Vagina: normal  appearing vagina with normal color and discharge, no lesions, blood present, affirm taken              Cervix: normal, no lesions or tenderness              Pap taken: Yes.   Bimanual Exam:  Uterus:  normal size, contour, position, consistency, mobility, non-tender and anteverted              Adnexa: normal adnexa and no mass, fullness, tenderness               Rectovaginal: Confirms               Anus:  normal appearance  Chaperone present: yes  A:  Well Woman with normal exam  Contraception OCP desired  working well  STD screening  Left breast cyst ? Resolved due for follow up US  P:   Reviewed health and wellness pertinent to exam  Rx Ortho Novum 1/35 see order  Lab: Gc,Chlamydia, HIV, RPR, Affirm  Discussed finding and will schedule for [place] today  Pap smear as above with HPV reflex   counseled on breast self exam, STD prevention, HIV risk factors and prevention, use and side effects of OCP's, adequate intake of calcium and vitamin D, diet and exercise  return annually or prn  An After Visit Summary was printed and given to the patient.

## 2015-08-08 NOTE — Progress Notes (Signed)
Scheduled patient while in office for follow up left breast [facility] with [facility]. Appointment scheduled for 10/11 at 11am. Agreeable to date and time.

## 2015-08-08 NOTE — Telephone Encounter (Signed)
Pt was seen for annual exam today.  Appointment for breast ultrasound scheduled while patient was in office.  Pt to have ultrasound 08/13/15.  Recall extended to 08/2015.  Routing to provider for review.  Closing encounter.

## 2015-08-09 ENCOUNTER — Other Ambulatory Visit: Payer: Self-pay | Admitting: Certified Nurse Midwife

## 2015-08-09 DIAGNOSIS — N760 Acute vaginitis: Principal | ICD-10-CM

## 2015-08-09 DIAGNOSIS — B9689 Other specified bacterial agents as the cause of diseases classified elsewhere: Secondary | ICD-10-CM

## 2015-08-09 LAB — WET PREP BY MOLECULAR PROBE
Candida species: NEGATIVE
GARDNERELLA VAGINALIS: POSITIVE — AB
Trichomonas vaginosis: NEGATIVE

## 2015-08-09 LAB — IPS PAP TEST WITH REFLEX TO HPV

## 2015-08-09 MED ORDER — HYLAFEM VA SUPP: 1.0000 | Freq: Every day | VAGINAL | Status: AC

## 2015-08-09 NOTE — Progress Notes (Signed)
Encounter reviewed Jill Jertson, MD   

## 2015-08-13 ENCOUNTER — Other Ambulatory Visit: Payer: BC Managed Care – PPO

## 2015-08-13 LAB — IPS N GONORRHOEA AND CHLAMYDIA BY PCR

## 2015-08-14 ENCOUNTER — Telehealth: Payer: Self-pay

## 2015-08-14 NOTE — Telephone Encounter (Signed)
Patient notified of results. See lab 

## 2015-08-14 NOTE — Telephone Encounter (Signed)
Voicemail not set up. Try again. 

## 2015-08-14 NOTE — Telephone Encounter (Signed)
-----   Message from Verner Choleborah S Leonard, CNM sent at 08/14/2015  6:01 AM EDT ----- Notify Gc,Chlamydia negative

## 2015-10-03 ENCOUNTER — Encounter: Payer: Self-pay | Admitting: Obstetrics & Gynecology

## 2015-10-03 ENCOUNTER — Telehealth: Payer: Self-pay | Admitting: Emergency Medicine

## 2015-10-03 NOTE — Telephone Encounter (Signed)
Pt is clearly aware of need.  Letter written and can be printed and signed.  Then ok to remove pt from any recall and/or hold.  Thanks.

## 2015-10-03 NOTE — Telephone Encounter (Signed)
Dr. Hyacinth MeekerMiller,  Patient in 04 recall for 3 month recall for 04/03/15 for Breast Ultrasound at [facility] [facility].  Patient did not complete imaging.  Patient came in for annual exam on 08/08/15 with Leota Sauerseborah Leonard CNM and was scheduled while in office for follow up imaging.  [facility] faxed to [facility] were returned from scanning as patient did not keep appointment.  Please advise how to proceed with recall.   cc Francee PiccoloStephanie Phillips.

## 2015-10-04 NOTE — Telephone Encounter (Signed)
Letter printed and to Dr. Hyacinth MeekerMiller for signature.

## 2015-10-04 NOTE — Telephone Encounter (Signed)
Letter to be mailed certified on 10/07/15 and will close encounter.

## 2015-10-04 NOTE — Telephone Encounter (Signed)
Completed current recall.

## 2016-08-11 ENCOUNTER — Ambulatory Visit: Payer: BC Managed Care – PPO | Admitting: Certified Nurse Midwife
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
